# Patient Record
Sex: Male | Born: 1980 | Race: White | Hispanic: No | Marital: Single | Smoking: Never smoker
Health system: Southern US, Community
[De-identification: ages and names within clinical notes are randomized; demographics above are authoritative.]

## PROBLEM LIST (undated history)

## (undated) DIAGNOSIS — G4730 Sleep apnea, unspecified: Secondary | ICD-10-CM

## (undated) DIAGNOSIS — K219 Gastro-esophageal reflux disease without esophagitis: Secondary | ICD-10-CM

## (undated) HISTORY — PX: VASECTOMY: SHX75

## (undated) HISTORY — DX: Sleep apnea, unspecified: G47.30

## (undated) HISTORY — DX: Gastro-esophageal reflux disease without esophagitis: K21.9

---

## 2010-11-30 ENCOUNTER — Other Ambulatory Visit: Payer: Self-pay | Admitting: Gastroenterology

## 2010-12-06 ENCOUNTER — Ambulatory Visit
Admission: RE | Admit: 2010-12-06 | Discharge: 2010-12-06 | Disposition: A | Discharge status: Home / Self Care | Payer: BC Managed Care – PPO | Source: Ambulatory Visit | Attending: Gastroenterology | Admitting: Gastroenterology

## 2012-06-15 IMAGING — RF DG ESOPHAGUS
12 of 14 series · 19 of 24 positions shown · non-contrast
Comparison: None.

CLINICAL DATA: Dysphagia

ESOPHOGRAM/BARIUM SWALLOW
TECHNIQUE: Combined double contrast and single contrast
examination performed using effervescent crystals, thick barium
liquid, and thin barium liquid.
Fluoroscopy time:  1.6 minutes.

[Series 3: run · 1 of 1 slices shown (1 of 12)]
[im 1/1]
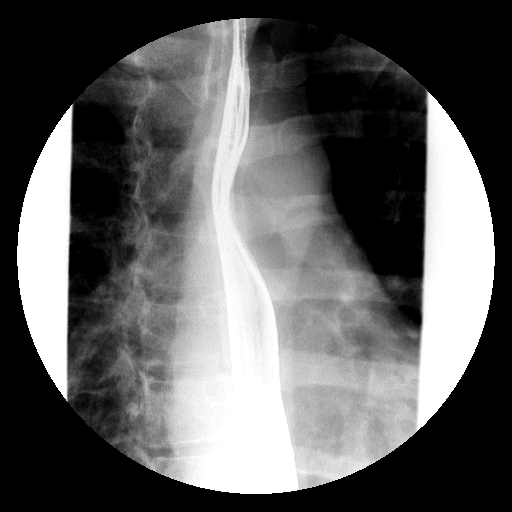

[Series 4: run · 1 of 1 slices shown (2 of 12)]
[im 1/1]
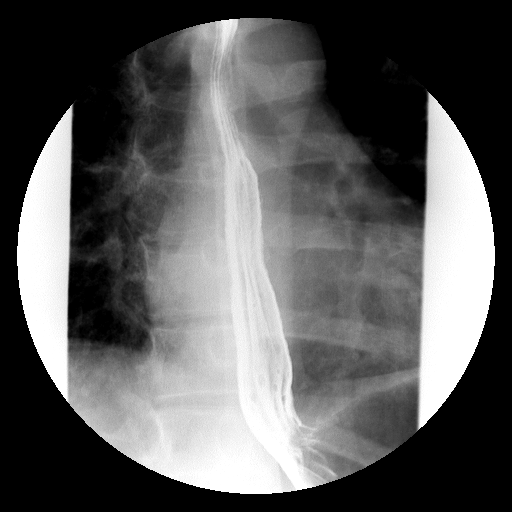

[Series 6: run · 1 of 1 slices shown (3 of 12)]
[im 1/1]
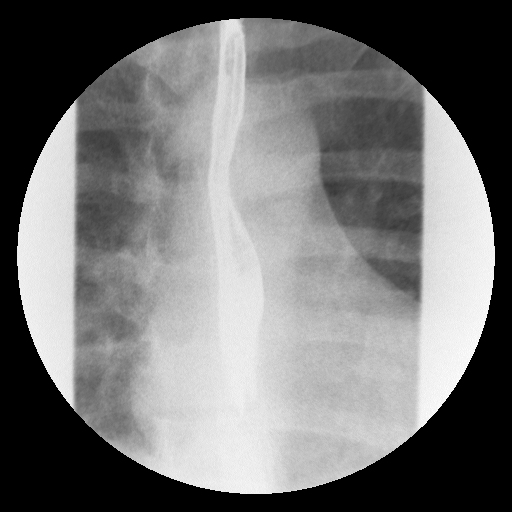

[Series 7: run · 1 of 1 slices shown (4 of 12)]
[im 1/1]
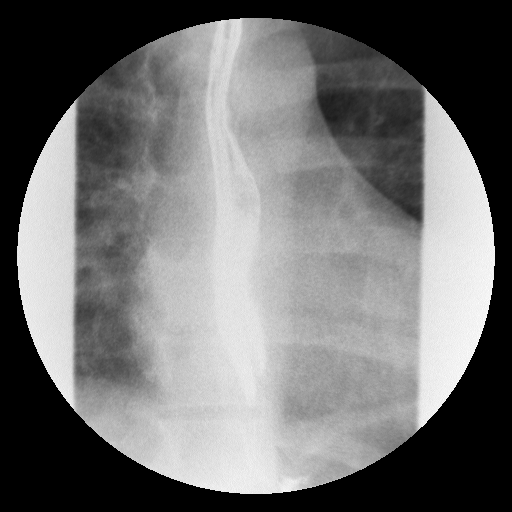

[Series 9: run · 5 of 9 slices shown (5 of 12)]
[im 1/9]
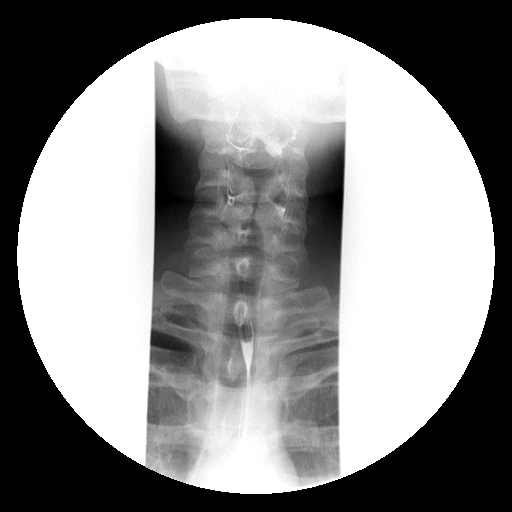
[im 2/9]
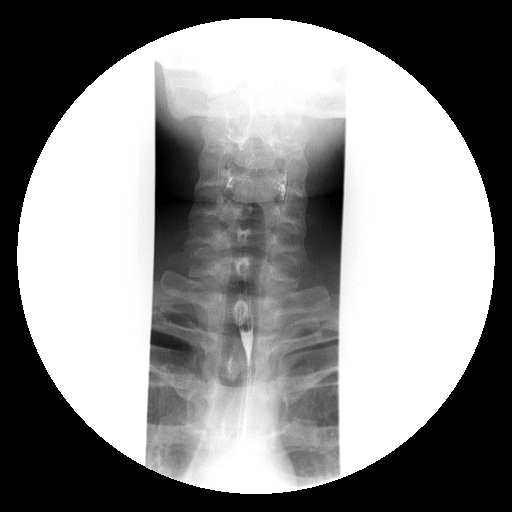
[im 5/9]
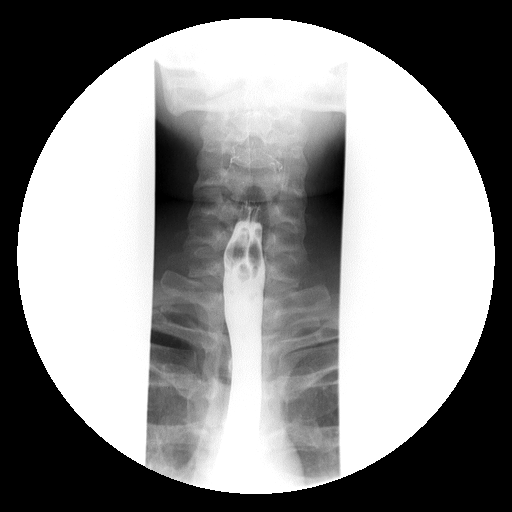
[im 6/9]
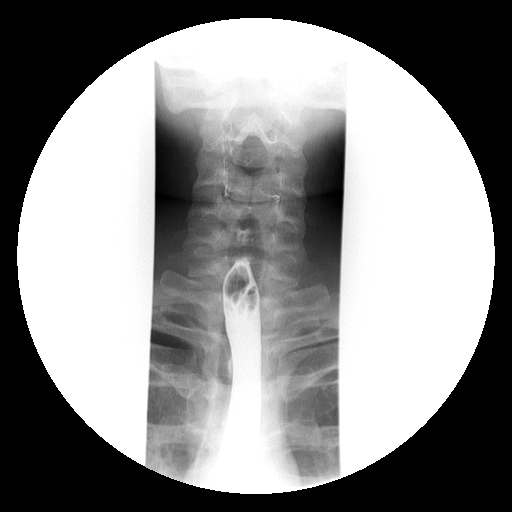
[im 7/9]
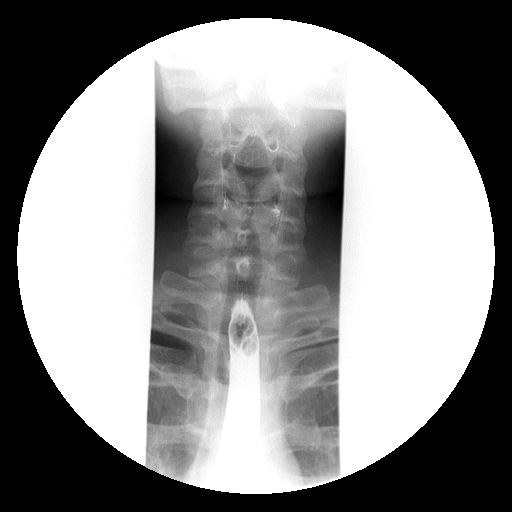

[Series 10: run · 4 of 7 slices shown (6 of 12)]
[im 1/7]
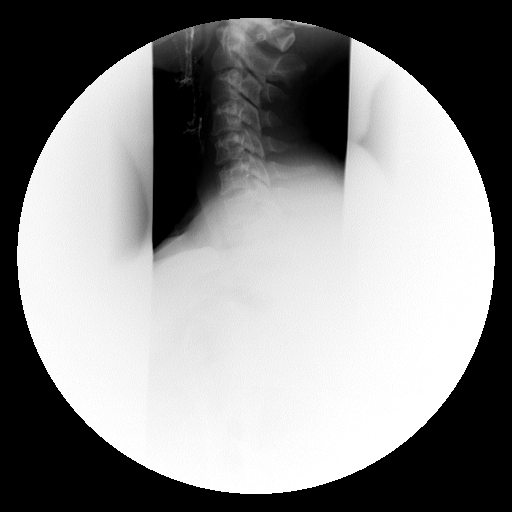
[im 2/7]
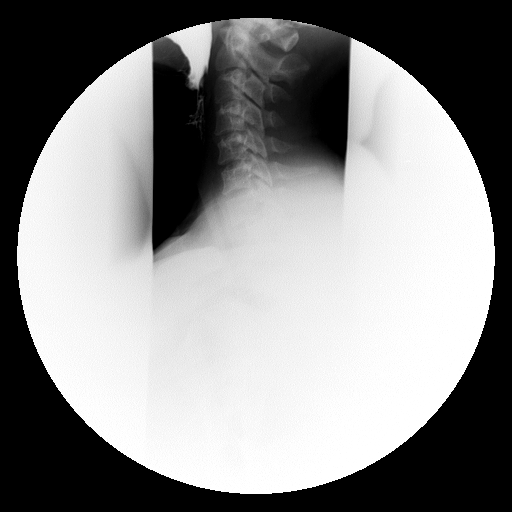
[im 4/7]
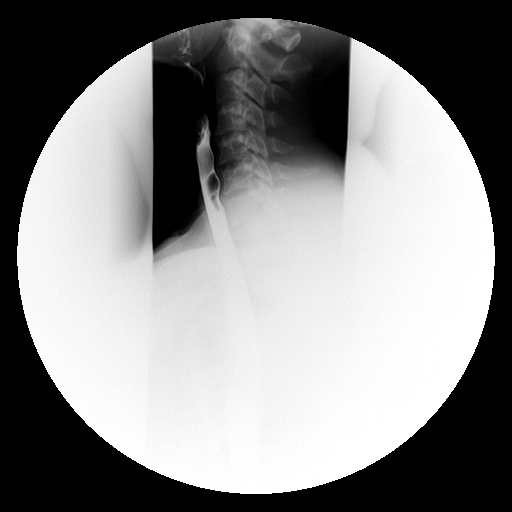
[im 5/7]
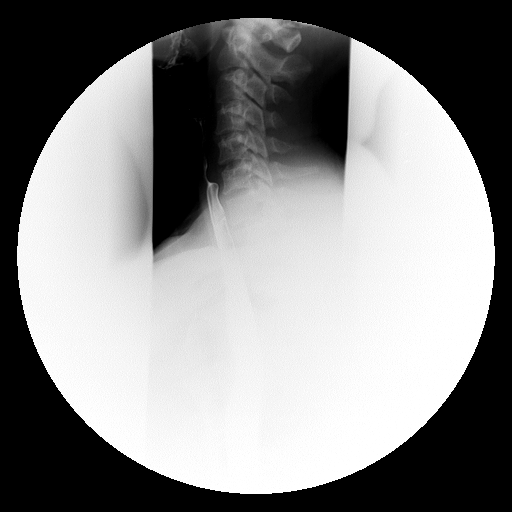

[Series 11: run · 1 of 1 slices shown (7 of 12)]
[im 1/1]
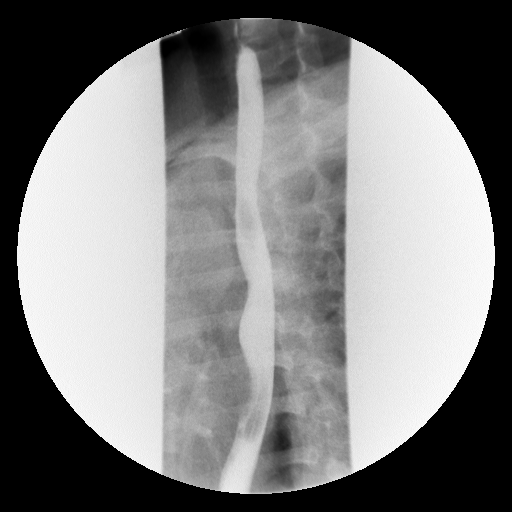

[Series 12: run · 1 of 1 slices shown (8 of 12)]
[im 1/1]
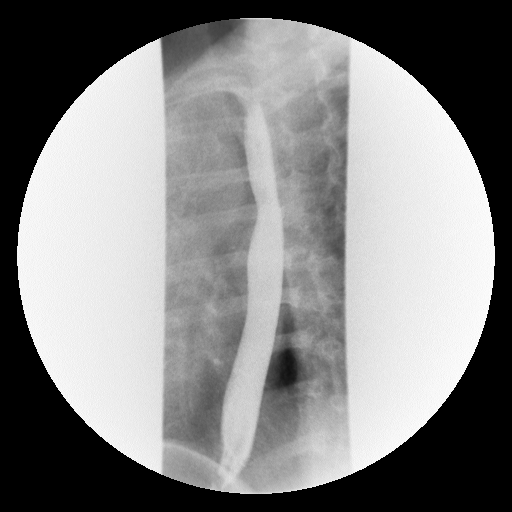

[Series 13: run · 1 of 1 slices shown (9 of 12)]
[im 1/1]
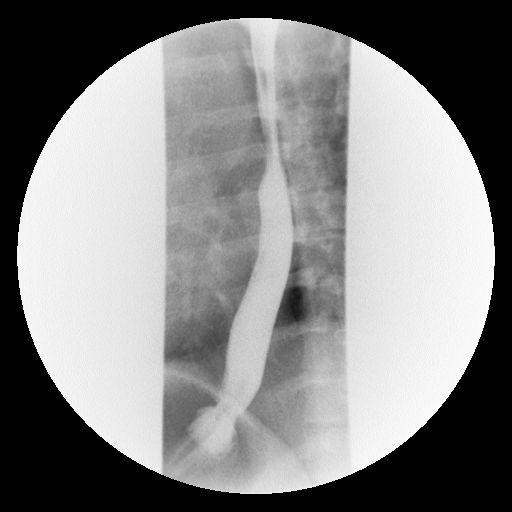

[Series 14: run · 1 of 1 slices shown (10 of 12)]
[im 1/1]
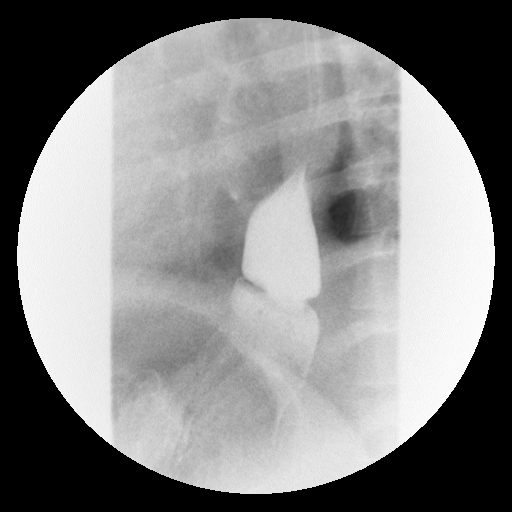

[Series 17: run · 1 of 1 slices shown (11 of 12)]
[im 1/1]
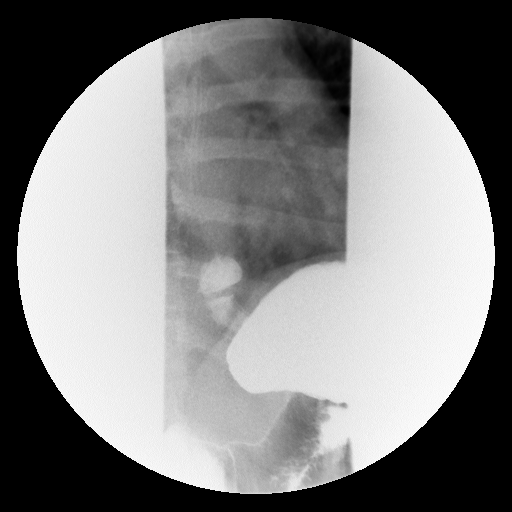

[Series 18: run · 1 of 1 slices shown (12 of 12)]
[im 1/1]
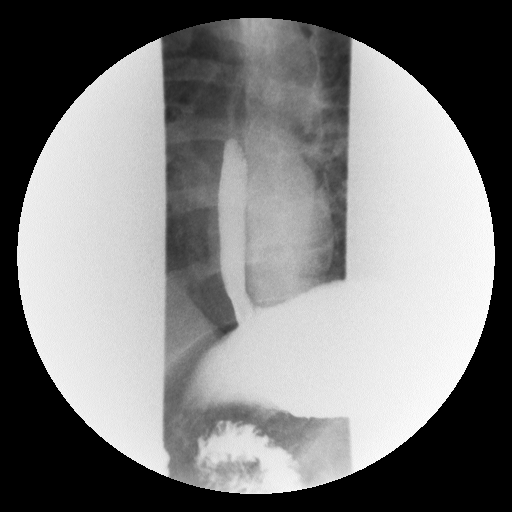

[19 of 24 positions shown; findings below may reference images not displayed]

FINDINGS: The esophagus does not distend optimally on the double
contrast study but no mucosal abnormality is seen.  A single
contrast study shows the swallowing mechanism to be normal.
Esophageal peristalsis is normal.  There is however a small hiatal
hernia present.  Moderate gastroesophageal reflux is demonstrated.
A barium pill was given at the end of the study which did pass into
the stomach without delay.
IMPRESSION: 1.  Small hiatal hernia with moderate reflux.
2.  Barium pill passes in the stomach without delay.

## 2017-05-17 DIAGNOSIS — Z23 Encounter for immunization: Secondary | ICD-10-CM | POA: Diagnosis not present

## 2017-05-17 DIAGNOSIS — Z1389 Encounter for screening for other disorder: Secondary | ICD-10-CM | POA: Diagnosis not present

## 2017-05-17 DIAGNOSIS — K219 Gastro-esophageal reflux disease without esophagitis: Secondary | ICD-10-CM | POA: Diagnosis not present

## 2017-05-17 DIAGNOSIS — Z0000 Encounter for general adult medical examination without abnormal findings: Secondary | ICD-10-CM | POA: Diagnosis not present

## 2018-05-14 DIAGNOSIS — Z0000 Encounter for general adult medical examination without abnormal findings: Secondary | ICD-10-CM | POA: Diagnosis not present

## 2018-05-14 DIAGNOSIS — Z125 Encounter for screening for malignant neoplasm of prostate: Secondary | ICD-10-CM | POA: Diagnosis not present

## 2018-05-14 DIAGNOSIS — R82998 Other abnormal findings in urine: Secondary | ICD-10-CM | POA: Diagnosis not present

## 2018-05-21 DIAGNOSIS — K219 Gastro-esophageal reflux disease without esophagitis: Secondary | ICD-10-CM | POA: Diagnosis not present

## 2018-05-21 DIAGNOSIS — Z0000 Encounter for general adult medical examination without abnormal findings: Secondary | ICD-10-CM | POA: Diagnosis not present

## 2018-05-21 DIAGNOSIS — Z1389 Encounter for screening for other disorder: Secondary | ICD-10-CM | POA: Diagnosis not present

## 2018-05-21 DIAGNOSIS — Z23 Encounter for immunization: Secondary | ICD-10-CM | POA: Diagnosis not present

## 2022-07-17 ENCOUNTER — Telehealth: Payer: Self-pay | Admitting: Neurology

## 2022-07-17 NOTE — Telephone Encounter (Signed)
LVM informing pt of need to reschedule 11/7 appointment - MD out

## 2022-07-24 ENCOUNTER — Institutional Professional Consult (permissible substitution): Payer: Commercial Managed Care - PPO | Admitting: Neurology

## 2022-08-07 ENCOUNTER — Ambulatory Visit (INDEPENDENT_AMBULATORY_CARE_PROVIDER_SITE_OTHER): Payer: Commercial Managed Care - PPO | Admitting: Neurology

## 2022-08-07 ENCOUNTER — Encounter: Payer: Self-pay | Admitting: Neurology

## 2022-08-07 VITALS — BP 128/85 | HR 66 | Ht 66.0 in | Wt 196.2 lb

## 2022-08-07 DIAGNOSIS — R351 Nocturia: Secondary | ICD-10-CM

## 2022-08-07 DIAGNOSIS — G4719 Other hypersomnia: Secondary | ICD-10-CM

## 2022-08-07 DIAGNOSIS — R0683 Snoring: Secondary | ICD-10-CM | POA: Diagnosis not present

## 2022-08-07 DIAGNOSIS — Z9189 Other specified personal risk factors, not elsewhere classified: Secondary | ICD-10-CM

## 2022-08-07 DIAGNOSIS — E669 Obesity, unspecified: Secondary | ICD-10-CM

## 2022-08-07 DIAGNOSIS — Z820 Family history of epilepsy and other diseases of the nervous system: Secondary | ICD-10-CM | POA: Diagnosis not present

## 2022-08-07 NOTE — Patient Instructions (Signed)

## 2022-08-07 NOTE — Progress Notes (Signed)
Subjective:    Patient ID: Brian Harris is a 41 y.o. male.  HPI    Brian Foley, MD, PhD [facility] [address] [address] [place], [place] [address]  Dear Dr. Evlyn Harris,  I saw your patient, Brian Harris, upon your kind request in my sleep clinic today for initial consultation of his sleep disorder, in particular, concern for underlying obstructive sleep apnea.  The patient is unaccompanied today.  As you know, [name] is a 41 year old male with an underlying medical history of reflux disease, allergies, and borderline obesity, who reports snoring and excessive daytime somnolence as well as nonrestorative sleep.  I reviewed your office note from 06/11/2022.  His Epworth sleepiness score is 7 out of 24, fatigue severity score is 37 out of 63.  He believes that his mom has sleep apnea and has an oral appliance.  His sister is being evaluated for sleep issues as well.  He lives with his wife and 2 children, ages 70 and 76.  They have no pets in the household.  He works for a [employer], works from home.  Bedtime is generally around 9 PM and rise time around 5 AM but he is often awake before, he has trouble maintaining sleep.  He does not currently take any prescription medications.  He has nocturia about once per average night and denies any recurrent nocturnal or morning headaches.  He has limited caffeine, 2 cups of coffee in the morning, occasional soda after lunch but typically no later than 2:30 PM.  He has had slow weight gain over time, over the years.  They have a TV in the bedroom and his wife puts it on a sleep timer typically.  His snoring has become worse over time and often his wife ends up sleeping in a different bedroom.  He drinks alcohol maybe once a week, up to 3 drinks.  He is a non-smoker.  His Past Medical History Is Significant For: History reviewed. No pertinent past medical history.  His Past Surgical History Is Significant  For: History reviewed. No pertinent surgical history.  His Family History Is Significant For: Family History  Problem Relation Age of Onset   Breast cancer Mother     His Social History Is Significant For: Social History   Socioeconomic History   Marital status: Single    Spouse name: Not on file   Number of children: Not on file   Years of education: Not on file   Highest education level: Not on file  Occupational History   Not on file  Tobacco Use   Smoking status: Never   Smokeless tobacco: Never  Vaping Use   Vaping Use: Never used  Substance and Sexual Activity   Alcohol use: Yes    Comment: 3 per week   Drug use: Never   Sexual activity: Not on file  Other Topics Concern   Not on file  Social History Narrative   Caffiene 2 cups daily   Married, Wife, 2 kids   Bachelors degree,    Works as an Community education officer of [employer] Strain: Not on [employer] Insecurity: Not on file  Transportation Needs: Not on file  Physical Activity: Not on file  Stress: Not on file  Social Connections: Not on file    His Allergies Are:  No Known Allergies:   His Current Medications Are:  No outpatient encounter medications on file as of 08/07/2022.  No facility-administered encounter medications on file as of 08/07/2022.  :   Review of Systems:  Out of a complete 14 point review of systems, all are reviewed and negative with the exception of these symptoms as listed below:  Review of Systems  Neurological:        Snoring, daytime fatigue.  ESS 7, FSS 37.  No previous sleep study.    Objective:  Neurological Exam  Physical Exam Physical Examination:   Vitals:   08/07/22 1117  BP: 128/85  Pulse: 66    General Examination: The patient is a very pleasant 41 y.o. male in no acute distress. He appears well-developed and well-nourished and well groomed.   HEENT: Normocephalic, atraumatic, pupils are equal, round and reactive to  light, extraocular tracking is good without limitation to gaze excursion or nystagmus noted. Hearing is grossly intact. Face is symmetric with normal facial animation. Speech is clear with no dysarthria noted. There is no hypophonia. There is no lip, neck/head, jaw or voice tremor. Neck is supple with full range of passive and active motion. There are no carotid bruits on auscultation. Oropharynx exam reveals: mild mouth dryness, good dental hygiene and mild airway crowding, due to tonsillar size of about 1+ bilaterally, enlarged uvula.  Mallampati class III.  Neck circumference of 16-1/2 inches.  Minimal overbite.  Tongue protrudes centrally and palate elevates symmetrically, nasal inspection reveals narrow nasal passages, otherwise benign.  Chest: Clear to auscultation without wheezing, rhonchi or crackles noted.  Heart: S1+S2+0, regular and normal without murmurs, rubs or gallops noted.   Abdomen: Soft, non-tender and non-distended.  Extremities: There is no pitting edema in the distal lower extremities bilaterally.   Skin: Warm and dry without trophic changes noted.   Musculoskeletal: exam reveals no obvious joint deformities.   Neurologically:  Mental status: The patient is awake, alert and oriented in all 4 spheres. His immediate and remote memory, attention, language skills and fund of knowledge are appropriate. There is no evidence of aphasia, agnosia, apraxia or anomia. Speech is clear with normal prosody and enunciation. Thought process is linear. Mood is normal and affect is normal.  Cranial nerves II - XII are as described above under HEENT exam.  Motor exam: Normal bulk, strength and tone is noted. There is no obvious action or resting tremor.  Fine motor skills and coordination: grossly intact.  Cerebellar testing: No dysmetria or intention tremor. There is no truncal or gait ataxia.  Sensory exam: intact to light touch in the upper and lower extremities.  Gait, station and  balance: He stands easily. No veering to one side is noted. No leaning to one side is noted. Posture is age-appropriate and stance is narrow based. Gait shows normal stride length and normal pace. No problems turning are noted.   Assessment and Plan:  In summary, Brian Harris is a very pleasant 41 y.o.-year old male with an underlying medical history of reflux disease, allergies, and borderline obesity, whose history and physical exam are concerning for sleep disordered breathing, supporting a current working diagnosis of unspecified sleep apnea, with the main differential diagnoses of obstructive sleep apnea (OSA) versus upper airway resistance syndrome (UARS) versus central sleep apnea (CSA), or mixed sleep apnea. A laboratory attended sleep study is considered gold standard for evaluation of sleep disordered breathing and is recommended at this time and clinically justified.   I had a long chat with the patient about my findings and the diagnosis of sleep apnea, particularly OSA, its prognosis and  treatment options. We talked about medical/conservative treatments, surgical interventions and non-pharmacological approaches for symptom control. I explained, in particular, the risks and ramifications of untreated moderate to severe OSA, especially with respect to developing cardiovascular disease down the road, including congestive heart failure (CHF), difficult to treat hypertension, cardiac arrhythmias (particularly A-fib), neurovascular complications including TIA, stroke and dementia. Even type 2 diabetes has, in part, been linked to untreated OSA. Symptoms of untreated OSA may include (but may not be limited to) daytime sleepiness, nocturia (i.e. frequent nighttime urination), memory problems, mood irritability and suboptimally controlled or worsening mood disorder such as depression and/or anxiety, lack of energy, lack of motivation, physical discomfort, as well as recurrent headaches, especially  morning or nocturnal headaches. We talked about the importance of maintaining a healthy lifestyle and striving for healthy weight. In addition, we talked about the importance of striving for and maintaining good sleep hygiene. I recommended the following at this time: sleep study.  I outlined the differences between a laboratory attended sleep study which is considered more comprehensive and accurate over the option of a home sleep test (HST); the latter may lead to underestimation of sleep disordered breathing in some instances and does not help with diagnosing upper airway resistance syndrome and is not accurate enough to diagnose primary central sleep apnea typically. I explained the different sleep test procedures to the patient in detail and also outlined possible surgical and non-surgical treatment options of OSA, including the use of a pressure airway pressure (PAP) device (ie CPAP, AutoPAP/APAP or BiPAP in certain circumstances), a custom-made dental device (aka oral appliance, which would require a referral to a specialist dentist or orthodontist typically, and is generally speaking not considered a good choice for patients with full dentures or edentulous state), upper airway surgical options, such as traditional UPPP (which is not considered a first-line treatment) or the Inspire device (hypoglossal nerve stimulator, which would involve a referral for consultation with an ENT surgeon, after careful selection, following inclusion criteria). I explained the PAP treatment option to the patient in detail, as this is generally considered first-line treatment.  The patient indicated that he would be willing to try PAP therapy, if the need arises. I explained the importance of being compliant with PAP treatment, not only for insurance purposes but primarily to improve patient's symptoms symptoms, and for the patient's long term health benefit, including to reduce His cardiovascular risks longer-term.    We  will pick up our discussion about the next steps and treatment options after testing.  We will keep him posted as to the test results by phone call and/or MyChart messaging where possible.  We will plan to follow-up in sleep clinic accordingly as well.  I answered all his questions today and the patient was in agreement.   I encouraged him to call with any interim questions, concerns, problems or updates or email [place] through MyChart.  Generally speaking, sleep test authorizations may take up to 2 weeks, sometimes less, sometimes longer, the patient is encouraged to get in touch with [place] if they do not hear back from the sleep lab staff directly within the next 2 weeks.  Thank you very much for allowing me to participate in the care of this nice patient. If I can be of any further assistance to you please do not hesitate to call me at [phone].  Sincerely,   Brian Foley, MD, PhD

## 2022-10-11 ENCOUNTER — Telehealth: Payer: Self-pay | Admitting: Neurology

## 2022-10-11 NOTE — Telephone Encounter (Signed)
HST- [facility] no auth req ref # [id number] on 10/10/22   Patient is scheduled at [facility] for 10/30/22 at 1:30 pm.  Mailed packet to the patient.

## 2022-10-30 ENCOUNTER — Ambulatory Visit: Payer: Commercial Managed Care - PPO | Admitting: Neurology

## 2022-10-30 DIAGNOSIS — Z9189 Other specified personal risk factors, not elsewhere classified: Secondary | ICD-10-CM

## 2022-10-30 DIAGNOSIS — R0683 Snoring: Secondary | ICD-10-CM

## 2022-10-30 DIAGNOSIS — R351 Nocturia: Secondary | ICD-10-CM

## 2022-10-30 DIAGNOSIS — G4719 Other hypersomnia: Secondary | ICD-10-CM

## 2022-10-30 DIAGNOSIS — E66811 Obesity, class 1: Secondary | ICD-10-CM

## 2022-10-30 DIAGNOSIS — Z820 Family history of epilepsy and other diseases of the nervous system: Secondary | ICD-10-CM

## 2022-10-30 DIAGNOSIS — E669 Obesity, unspecified: Secondary | ICD-10-CM

## 2022-10-30 DIAGNOSIS — G4733 Obstructive sleep apnea (adult) (pediatric): Secondary | ICD-10-CM

## 2022-11-01 NOTE — Progress Notes (Signed)
See procedure note.

## 2022-11-02 NOTE — Addendum Note (Signed)
Addended by: Star Age on: 11/02/2022 10:50 AM   Modules accepted: Orders

## 2022-11-02 NOTE — Procedures (Signed)
   [facility]  HOME SLEEP TEST (Watch PAT) REPORT  STUDY DATE: 10/30/2022  DOB: 01-11-1981  MRN: [id number]  ORDERING CLINICIAN: Star Age, MD, PhD   REFERRING CLINICIAN: Reynold Bowen, MD   CLINICAL INFORMATION/HISTORY: 42 year old male with an underlying medical history of reflux disease, allergies, and borderline obesity, who reports snoring and excessive daytime somnolence as well as nonrestorative sleep.   Epworth sleepiness score: 7/24.  BMI: 31.5 kg/m  FINDINGS:   Sleep Summary:   Total Recording Time (hours, min): 8 hours, 37 min  Total Sleep Time (hours, min):  7 hours, 50 min  Percent REM (%):    31.9%   Respiratory Indices:   Calculated pAHI (per hour):  13.8/hour         REM pAHI:    29.6/hour       NREM pAHI: 6.7/hour  Central pAHI: 0/hour  Oxygen Saturation Statistics:    Oxygen Saturation (%) Mean: 93%   Minimum oxygen saturation (%):                 89%   O2 Saturation Range (%): 89 - 98%    O2 Saturation (minutes) <=88%: 0 min  Pulse Rate Statistics:   Pulse Mean (bpm):   59/min    Pulse Range (47- 110/min)   IMPRESSION: OSA (obstructive sleep apnea), mild  RECOMMENDATION:  This home sleep test demonstrates overall mild obstructive sleep apnea with a total AHI of 13.8/hour and O2 nadir of 89%. Snoring was detected, and was intermittent, in the mild to moderate range. Given the patient's medical history and sleep related complaints, therapy with a  positive airway pressure device can be considered. Treatment can be achieved in the form of autoPAP trial/titration at home for now. A full night, in-lab PAP titration study may aid in improving proper treatment settings and with mask fit, if needed, down the road. Alternative treatments may include weight loss (where appropriate) along with avoidance of the supine sleep position (if possible), or an oral appliance in appropriate candidates.   Please note that untreated  obstructive sleep apnea may carry additional perioperative morbidity. Patients with significant obstructive sleep apnea should receive perioperative PAP therapy and the surgeons and particularly the anesthesiologist should be informed of the diagnosis and the severity of the sleep disordered breathing. The patient should be cautioned not to drive, work at heights, or operate dangerous or heavy equipment when tired or sleepy. Review and reiteration of good sleep hygiene measures should be pursued with any patient. Other causes of the patient's symptoms, including circadian rhythm disturbances, an underlying mood disorder, medication effect and/or an underlying medical problem cannot be ruled out based on this test. Clinical correlation is recommended.  The patient and his referring provider will be notified of the test results. The patient will be seen in follow up in sleep clinic at [facility], as necessary.  I certify that I have reviewed the raw data recording prior to the issuance of this report in accordance with the standards of the American Academy of Sleep Medicine (AASM).    INTERPRETING PHYSICIAN:   Star Age, MD, PhD Medical Director, [facility] [place], ABPN (Neurology and Sleep)   [facility] [address], [place], [place] [address] [phone]

## 2022-11-05 ENCOUNTER — Telehealth: Payer: Self-pay | Admitting: *Deleted

## 2022-11-05 NOTE — Telephone Encounter (Signed)
Called pt & discussed sleep study results. Patient aware HST showed mild OSA. He is ok with proceeding with autopap but only if he is satisfied with cost which he will discuss in detail with the DME company before setup. We discussed insurance compliance requirements which includes using the machine at least 4 hours at night and being seen in our office for an initial follow-up between 30 and 90 days after setup. Pt's questions were answered. Will try [place] first for DME.   Referral faxed to [facility]. Received a receipt of confirmation. Results sent to Dr Forde Dandy.

## 2022-11-05 NOTE — Telephone Encounter (Signed)
-----   Message from Star Age, MD sent at 11/02/2022 10:50 AM EST ----- Patient referred by Dr. Forde Dandy, seen by me on 08/07/2022, HST 10/30/2022.    Please call and notify the patient that the recent home sleep test showed obstructive sleep apnea. OSA is overall mild, but worth treating to see if he feels better after treatment. To that end I recommend treatment for this in the form of autoPAP, which means, that we don't have to bring him in for a sleep study with CPAP, but will let him try an autoPAP machine at home, through a DME company (of his choice, or as per insurance requirement). The DME representative will educate him on how to use the machine, how to put the mask on, etc. I have placed an order in the chart. Please send referral, talk to patient, send report to referring MD. We will need a FU in sleep clinic for 10 weeks post-PAP set up, please arrange that with me or one of our NPs. Thanks,   Star Age, MD, PhD [facility])

## 2022-11-08 NOTE — Telephone Encounter (Signed)
Received fax from [facility] which stated they do not take pt's insurance for CPAP. I have called patient and discussed other options. Will refer to [facility]. He was given their phone number to call if needed.   Referral sent to [facility].

## 2022-11-12 NOTE — Telephone Encounter (Signed)
[employer] confirmed receipt of order.

## 2022-12-07 ENCOUNTER — Telehealth: Payer: Self-pay | Admitting: Neurology

## 2022-12-07 NOTE — Telephone Encounter (Signed)
Pt was scheduled for his initial CPAP on (01/30/23) Pt was informed to bring machine and power cord to the appointment.   DME and between dates are in pt's SnapShot.

## 2023-01-29 NOTE — Patient Instructions (Signed)

## 2023-01-29 NOTE — Progress Notes (Unsigned)
PATIENT: Brian Harris DOB: 04-10-1981  REASON FOR VISIT: follow up HISTORY FROM: patient  No chief complaint on file.    HISTORY OF PRESENT ILLNESS:  01/29/23 ALL:  Brian Harris is a 42 y.o. male here today for follow up for OSA on CPAP. He was seen in consult with Dr Frances Furbish 07/2022 for snoring, daytime sleepiness and non restorative sleep. HST 10/2022 showed mild OSA with total AHI 13.8/hrand O2 nadir of 89%. AutoPAP advised.     HISTORY: (copied from Dr Teofilo Pod previous note)  Dear Dr. Evlyn Kanner,   I saw your patient, Brian Harris, upon your kind request in my sleep clinic today for initial consultation of his sleep disorder, in particular, concern for underlying obstructive sleep apnea.  The patient is unaccompanied today.  As you know, [name] is a 42 year old male with an underlying medical history of reflux disease, allergies, and borderline obesity, who reports snoring and excessive daytime somnolence as well as nonrestorative sleep.  I reviewed your office note from 06/11/2022.  His Epworth sleepiness score is 7 out of 24, fatigue severity score is 37 out of 63.  He believes that his mom has sleep apnea and has an oral appliance.  His sister is being evaluated for sleep issues as well.  He lives with his wife and 2 children, ages 80 and 30.  They have no pets in the household.  He works for a [employer], works from home.  Bedtime is generally around 9 PM and rise time around 5 AM but he is often awake before, he has trouble maintaining sleep.  He does not currently take any prescription medications.  He has nocturia about once per average night and denies any recurrent nocturnal or morning headaches.  He has limited caffeine, 2 cups of coffee in the morning, occasional soda after lunch but typically no later than 2:30 PM.  He has had slow weight gain over time, over the years.  They have a TV in the bedroom and his wife puts it on a sleep timer typically.  His  snoring has become worse over time and often his wife ends up sleeping in a different bedroom.  He drinks alcohol maybe once a week, up to 3 drinks.  He is a non-smoker.   REVIEW OF SYSTEMS: Out of a complete 14 system review of symptoms, the patient complains only of the following symptoms, none and all other reviewed systems are negative.  ESS: previously 7/24  ALLERGIES: No Known Allergies  HOME MEDICATIONS: No outpatient medications prior to visit.   No facility-administered medications prior to visit.    PAST MEDICAL HISTORY: No past medical history on file.  PAST SURGICAL HISTORY: No past surgical history on file.  FAMILY HISTORY: Family History  Problem Relation Age of Onset   Breast cancer Mother     SOCIAL HISTORY: Social History   Socioeconomic History   Marital status: Single    Spouse name: Not on file   Number of children: Not on file   Years of education: Not on file   Highest education level: Not on file  Occupational History   Not on file  Tobacco Use   Smoking status: Never   Smokeless tobacco: Never  Vaping Use   Vaping Use: Never used  Substance and Sexual Activity   Alcohol use: Yes    Comment: 3 per week   Drug use: Never   Sexual activity: Not on file  Other Topics Concern   Not on  file  Social History Narrative   Caffiene 2 cups daily   Married, Wife, 2 kids   Bachelors degree,    Works as an Producer, television/film/video Strain: Not on [employer] Insecurity: Not on file  Transportation Needs: Not on file  Physical Activity: Not on file  Stress: Not on file  Social Connections: Not on file  Intimate Partner Violence: Not on file     PHYSICAL EXAM  There were no vitals filed for this visit. There is no height or weight on file to calculate BMI.  Generalized: Well developed, in no acute distress  Cardiology: normal rate and rhythm, no murmur noted Respiratory: clear to auscultation  bilaterally  Neurological examination  Mentation: Alert oriented to time, place, history taking. Follows all commands speech and language fluent Cranial nerve II-XII: Pupils were equal round reactive to light. Extraocular movements were full, visual field were full  Motor: The motor testing reveals 5 over 5 strength of all 4 extremities. Good symmetric motor tone is noted throughout.  Gait and station: Gait is normal.    DIAGNOSTIC DATA (LABS, IMAGING, TESTING) - I reviewed patient records, labs, notes, testing and imaging myself where available.      No data to display           No results found for: "WBC", "HGB", "HCT", "MCV", "PLT" No results found for: "NA", "K", "CL", "CO2", "GLUCOSE", "BUN", "CREATININE", "CALCIUM", "PROT", "ALBUMIN", "AST", "ALT", "ALKPHOS", "BILITOT", "GFRNONAA", "GFRAA" No results found for: "CHOL", "HDL", "LDLCALC", "LDLDIRECT", "TRIG", "CHOLHDL" No results found for: "HGBA1C" No results found for: "VITAMINB12" No results found for: "TSH"   ASSESSMENT AND PLAN 42 y.o. year old male  has no past medical history on file. here with   No diagnosis found.    [name] is doing well on CPAP therapy. Compliance report reveals ***. *** was encouraged to continue using CPAP nightly and for greater than 4 hours each night. We will update supply orders as indicated. Risks of untreated sleep apnea review and education materials provided. Healthy lifestyle habits encouraged. *** will follow up in ***, sooner if needed. *** verbalizes understanding and agreement with this plan.    No orders of the defined types were placed in this encounter.    No orders of the defined types were placed in this encounter.     Shawnie Dapper, FNP-C 01/29/2023, 5:17 PM [facility] [address] [place], [place] [address] [phone]

## 2023-01-30 ENCOUNTER — Ambulatory Visit (INDEPENDENT_AMBULATORY_CARE_PROVIDER_SITE_OTHER): Payer: Commercial Managed Care - PPO | Admitting: Family Medicine

## 2023-01-30 ENCOUNTER — Encounter: Payer: Self-pay | Admitting: Family Medicine

## 2023-01-30 VITALS — BP 138/85 | HR 76 | Ht 66.0 in | Wt 200.4 lb

## 2023-01-30 DIAGNOSIS — G4733 Obstructive sleep apnea (adult) (pediatric): Secondary | ICD-10-CM | POA: Diagnosis not present

## 2024-01-30 NOTE — Progress Notes (Signed)
 Brian Harris

## 2024-02-03 ENCOUNTER — Encounter: Payer: Self-pay | Admitting: Family Medicine

## 2024-02-03 ENCOUNTER — Ambulatory Visit: Payer: Commercial Managed Care - PPO | Admitting: Family Medicine

## 2024-02-03 VITALS — BP 132/84 | HR 58 | Ht 66.0 in | Wt 200.2 lb

## 2024-02-03 DIAGNOSIS — G4733 Obstructive sleep apnea (adult) (pediatric): Secondary | ICD-10-CM | POA: Diagnosis not present

## 2024-02-03 NOTE — Patient Instructions (Signed)

## 2024-02-03 NOTE — Progress Notes (Signed)
 Community message sent to [employer] that order placed.

## 2024-02-03 NOTE — Progress Notes (Signed)
 PATIENT: Brian Harris DOB: 03/19/1981  REASON FOR VISIT: follow up HISTORY FROM: patient  Chief Complaint  Patient presents with   Follow-up    Rm 2, alone. Last seen 01/30/23. CPAP f/u. Doing well, no changes.      HISTORY OF PRESENT ILLNESS:  02/03/24 ALL:  Brian Harris returns for follow up for OSA on CPAP. He was last seen by me 01/2023 and doing well. He continues to use therapy most every day. He went to [facility], recently, and forgot his machine. Otherwise, using it daily. He notes significant improvement in sleep quality and daytime energy levels. No concerns with machine or supplies.     01/30/2023 ALL:  Brian Harris is a 43 y.o. male here today for follow up for OSA on CPAP. He was seen in consult with Dr Omar Bibber 07/2022 for snoring, daytime sleepiness and non restorative sleep. HST 10/2022 showed mild OSA with total AHI 13.8/hr and O2 nadir of 89%. AutoPAP advised. He reports doing well on therapy. He is using CPAP nightly for about 6-7 hours. He does have intermittent congestion related to seasonal allergies. He has noted improvement in sleep quality and no longer snoring. He denies concerns with machine or supplies.     HISTORY: (copied from Dr Dail Drought previous note)  Dear Dr. Jesse Moritz,   I saw your patient, Brian Harris, upon your kind request in my sleep clinic today for initial consultation of his sleep disorder, in particular, concern for underlying obstructive sleep apnea.  The patient is unaccompanied today.  As you know, Brian Harris is a 43 year old male with an underlying medical history of reflux disease, allergies, and borderline obesity, who reports snoring and excessive daytime somnolence as well as nonrestorative sleep.  I reviewed your office note from 06/11/2022.  His Epworth sleepiness score is 7 out of 24, fatigue severity score is 37 out of 63.  He believes that his mom has sleep apnea and has an oral appliance.  His sister is being evaluated for  sleep issues as well.  He lives with his wife and 2 children, ages 61 and 44.  They have no pets in the household.  He works for a [employer], works from home.  Bedtime is generally around 9 PM and rise time around 5 AM but he is often awake before, he has trouble maintaining sleep.  He does not currently take any prescription medications.  He has nocturia about once per average night and denies any recurrent nocturnal or morning headaches.  He has limited caffeine, 2 cups of coffee in the morning, occasional soda after lunch but typically no later than 2:30 PM.  He has had slow weight gain over time, over the years.  They have a TV in the bedroom and his wife puts it on a sleep timer typically.  His snoring has become worse over time and often his wife ends up sleeping in a different bedroom.  He drinks alcohol maybe once a week, up to 3 drinks.  He is a non-smoker.   REVIEW OF SYSTEMS: Out of a complete 14 system review of symptoms, the patient complains only of the following symptoms, none and all other reviewed systems are negative.  ESS: 11/24, previously 10/24  ALLERGIES: No Known Allergies  HOME MEDICATIONS: Outpatient Medications Prior to Visit  Medication Sig Dispense Refill   levocetirizine (XYZAL) 5 MG tablet Take 5 mg by mouth every evening.     No facility-administered medications prior to visit.    PAST MEDICAL  HISTORY: History reviewed. No pertinent past medical history.  PAST SURGICAL HISTORY: History reviewed. No pertinent surgical history.  FAMILY HISTORY: Family History  Problem Relation Age of Onset   Breast cancer Mother     SOCIAL HISTORY: Social History   Socioeconomic History   Marital status: Single    Spouse name: Not on file   Number of children: Not on file   Years of education: Not on file   Highest education level: Not on file  Occupational History   Not on file  Tobacco Use   Smoking status: Never   Smokeless tobacco: Never  Vaping Use    Vaping status: Never Used  Substance and Sexual Activity   Alcohol use: Yes    Comment: 3 per week   Drug use: Never   Sexual activity: Not on file  Other Topics Concern   Not on file  Social History Narrative   Caffiene 2 cups daily   Married, Wife, 2 kids   Bachelors degree,    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  Generalized: Well developed, in no acute distress  Cardiology: normal rate and rhythm, no murmur noted Respiratory: clear to auscultation bilaterally  Neurological examination  Mentation: Alert oriented to time, place, history taking. Follows all commands speech and language fluent Cranial nerve II-XII: Pupils were equal round reactive to light. Extraocular movements were full, visual field were full  Motor: The motor testing reveals 5 over 5 strength of all 4 extremities. Good symmetric motor tone is noted throughout.  Gait and station: Gait is normal.    DIAGNOSTIC DATA (LABS, IMAGING, TESTING) - I reviewed patient records, labs, notes, testing and imaging myself where available.      No data to display           No results found for: "WBC", "HGB", "HCT", "MCV", "PLT" No results found for: "NA", "K", "CL", "CO2", "GLUCOSE", "BUN", "CREATININE", "CALCIUM", "PROT", "ALBUMIN", "AST", "ALT", "ALKPHOS", "BILITOT", "GFRNONAA", "GFRAA" No results found for: "CHOL", "HDL", "LDLCALC", "LDLDIRECT", "TRIG", "CHOLHDL" No results found for: "HGBA1C" No results found for: "VITAMINB12" No results found for:  "TSH"   ASSESSMENT AND PLAN 43 y.o. year old male  has no past medical history on file. here with     ICD-10-CM   1. OSA on CPAP  G47.33 For home use only DME continuous positive airway pressure (CPAP)        Jayvien Rowlette is doing well on CPAP therapy. Compliance report reveals excellent compliance. He was encouraged to continue using CPAP nightly and for greater than 4 hours each night. ESS is slightly elevated but he reports feeling better. We will continue to monitor. We will update supply orders as indicated. Risks of untreated sleep apnea review and education materials provided. Healthy lifestyle habits encouraged. He will follow up in 1 year, sooner if needed. He verbalizes understanding and agreement with this plan.     Orders Placed This  Encounter  Procedures   For home use only DME continuous positive airway pressure (CPAP)    Heated Humidity with all supplies as needed    Length of Need:   Lifetime    Patient has OSA or probable OSA:   Yes    Is the patient currently using CPAP in the home:   Yes    Settings:   Other see comments    CPAP supplies needed:   Mask, headgear, cushions, filters, heated tubing and water chamber     No orders of the defined types were placed in this encounter.     Terrilyn Fick, FNP-C 02/03/2024, 3:08 PM [facility] [address] [place], [place] [address] [phone]

## 2024-07-01 ENCOUNTER — Encounter: Payer: Self-pay | Admitting: Gastroenterology

## 2024-07-20 ENCOUNTER — Ambulatory Visit (AMBULATORY_SURGERY_CENTER): Admitting: *Deleted

## 2024-07-20 VITALS — Ht 66.0 in | Wt 198.0 lb

## 2024-07-20 DIAGNOSIS — Z1211 Encounter for screening for malignant neoplasm of colon: Secondary | ICD-10-CM

## 2024-07-20 MED ORDER — NA SULFATE-K SULFATE-MG SULF 17.5-3.13-1.6 GM/177ML PO SOLN: 1.0000 | Freq: Once | ORAL | 0 refills | Status: AC

## 2024-07-20 NOTE — Progress Notes (Signed)
 Pt's name and DOB verified at the beginning of the pre-visit with 2 identifiers   Pt denies any difficulty with ambulating,sitting, laying down or rolling side to side  Pt has no issues moving head neck or swallowing  No egg or soy allergy known to patient   No issues known to pt with past sedation  No FH of Malignant Hyperthermia  Pt is not on home 02   Pt is not on blood thinners   Pt denies issues with constipation   Pt is not on dialysis  Pt denise any abnormal heart rhythms   Pt denies any upcoming cardiac testing  Patient's chart reviewed by Norleen Schillings CNRA prior to pre-visit and patient appropriate for the [facility].  Pre-visit completed and red dot placed by patient's name on their procedure day (on provider's schedule).    Visit in person  Pt states weight is 198 lb   Pt given  both [facility] main # and MD on call # prior to instructions.  Informed pt to come in at the time discussed and is shown on PV instructions.  Pt instructed to use Single[employer].com or GoodRx for a price reduction on prep  Instructed pt where to find PV instructions in My Ch. Copy of instructions given to pt Instructed pt on all aspects of written instructions including med holds clothing to wear and foods to eat and not eat as well as after procedure legal restrictions and to call MD on call if needed.. Pt states understanding. Instructed pt to review instructions again prior to procedure and call main # given if has any questions or any issues. Pt states they will.

## 2024-08-03 ENCOUNTER — Encounter: Payer: Self-pay | Admitting: Gastroenterology

## 2024-08-04 ENCOUNTER — Encounter: Payer: Self-pay | Admitting: Gastroenterology

## 2024-08-04 ENCOUNTER — Ambulatory Visit (AMBULATORY_SURGERY_CENTER): Payer: PRIVATE HEALTH INSURANCE | Admitting: Gastroenterology

## 2024-08-04 VITALS — BP 126/75 | HR 63 | Temp 98.4°F | Resp 16 | Ht 66.0 in | Wt 198.0 lb

## 2024-08-04 DIAGNOSIS — D120 Benign neoplasm of cecum: Secondary | ICD-10-CM

## 2024-08-04 DIAGNOSIS — K5730 Diverticulosis of large intestine without perforation or abscess without bleeding: Secondary | ICD-10-CM | POA: Diagnosis not present

## 2024-08-04 DIAGNOSIS — D125 Benign neoplasm of sigmoid colon: Secondary | ICD-10-CM

## 2024-08-04 DIAGNOSIS — D124 Benign neoplasm of descending colon: Secondary | ICD-10-CM

## 2024-08-04 DIAGNOSIS — Z1211 Encounter for screening for malignant neoplasm of colon: Secondary | ICD-10-CM | POA: Diagnosis present

## 2024-08-04 MED ORDER — SODIUM CHLORIDE 0.9 % IV SOLN: 500.0000 mL | Freq: Once | INTRAVENOUS | Status: DC

## 2024-08-04 NOTE — Op Note (Signed)
 [facility] Patient Name: Brian Harris Procedure Date: 08/04/2024 10:03 AM MRN: [id number] Endoscopist: Glendia E. Stacia , MD, [id number] Age: 43 Referring MD:  Date of Birth: [birth year] Gender: Male Account #: [id number] Procedure:                Colonoscopy Indications:              Screening for colorectal malignant neoplasm, This                            is the patient's first colonoscopy Medicines:                Monitored Anesthesia Care Procedure:                Pre-Anesthesia Assessment:                           - Prior to the procedure, a History and Physical                            was performed, and patient medications and                            allergies were reviewed. The patient's tolerance of                            previous anesthesia was also reviewed. The risks                            and benefits of the procedure and the sedation                            options and risks were discussed with the patient.                            All questions were answered, and informed consent                            was obtained. Prior Anticoagulants: The patient has                            taken no anticoagulant or antiplatelet agents. ASA                            Grade Assessment: II - A patient with mild systemic                            disease. After reviewing the risks and benefits,                            the patient was deemed in satisfactory condition to                            undergo the procedure.  After obtaining informed consent, the colonoscope                            was passed under direct vision. Throughout the                            procedure, the patient's blood pressure, pulse, and                            oxygen saturations were monitored continuously. The                            Olympus Scope SN: [id number] was introduced through                            the anus and  advanced to the the terminal ileum,                            with identification of the appendiceal orifice and                            IC valve. The colonoscopy was performed without                            difficulty. The patient tolerated the procedure                            well. The quality of the bowel preparation was                            good. The terminal ileum, ileocecal valve,                            appendiceal orifice, and rectum were photographed. Scope In: 10:14:11 AM Scope Out: 10:36:02 AM Scope Withdrawal Time: 0 hours 16 minutes 18 seconds  Total Procedure Duration: 0 hours 21 minutes 51 seconds  Findings:                 The perianal and digital rectal examinations were                            normal. Pertinent negatives include normal                            sphincter tone and no palpable rectal lesions.                           A 7 mm polyp was found in the descending colon. The                            polyp was pedunculated. The polyp was removed with                            a cold snare.  Resection and retrieval were                            complete. Estimated blood loss was minimal.                           A 4 mm polyp was found in the cecum, just distal to                            the ICV. The polyp was flat. The polyp was removed                            with a cold snare. Resection and retrieval were                            complete. Estimated blood loss was minimal.                           An 8 mm polyp was found in the sigmoid colon. The                            polyp was sessile. The polyp was removed with a                            cold snare. Resection and retrieval were complete.                            Estimated blood loss was minimal.                           A single medium-mouthed diverticulum was found in                            the proximal transverse colon.                           The exam  was otherwise normal throughout the                            examined colon.                           The terminal ileum appeared normal.                           The retroflexed view of the distal rectum and anal                            verge was normal and showed no anal or rectal                            abnormalities. Complications:            No immediate complications. Estimated Blood Loss:     Estimated blood  loss was minimal. Impression:               - One 7 mm polyp in the descending colon, removed                            with a cold snare. Resected and retrieved.                           - One 4 mm polyp in the cecum, removed with a cold                            snare. Resected and retrieved.                           - One 8 mm polyp in the sigmoid colon, removed with                            a cold snare. Resected and retrieved.                           - Diverticulosis in the proximal transverse colon.                           - The examined portion of the ileum was normal.                           - The distal rectum and anal verge are normal on                            retroflexion view. Recommendation:           - Patient has a contact number available for                            emergencies. The signs and symptoms of potential                            delayed complications were discussed with the                            patient. Return to normal activities tomorrow.                            Written discharge instructions were provided to the                            patient.                           - Resume previous diet.                           - Continue present medications.                           -  Await pathology results.                           - Repeat colonoscopy (date not yet determined) for                            surveillance based on pathology results. Theodoros Stjames E. Stacia, MD 08/04/2024 10:44:09 AM This report  has been signed electronically.

## 2024-08-04 NOTE — Patient Instructions (Signed)
 Resume previous diet Continue present medications Await pathology results See handouts for polyps and diverticulosis YOU HAD AN ENDOSCOPIC PROCEDURE TODAY AT THE [facility]:   Refer to the procedure report that was given to you for any specific questions about what was found during the examination.  If the procedure report does not answer your questions, please call your gastroenterologist to clarify.  If you requested that your care partner not be given the details of your procedure findings, then the procedure report has been included in a sealed envelope for you to review at your convenience later.  YOU SHOULD EXPECT: Some feelings of bloating in the abdomen. Passage of more gas than usual.  Walking can help get rid of the air that was put into your GI tract during the procedure and reduce the bloating. If you had a lower endoscopy (such as a colonoscopy or flexible sigmoidoscopy) you may notice spotting of blood in your stool or on the toilet paper. If you underwent a bowel prep for your procedure, you may not have a normal bowel movement for a few days.  Please Note:  You might notice some irritation and congestion in your nose or some drainage.  This is from the oxygen used during your procedure.  There is no need for concern and it should clear up in a day or so.  SYMPTOMS TO REPORT IMMEDIATELY:  Following lower endoscopy (colonoscopy or flexible sigmoidoscopy):  Excessive amounts of blood in the stool  Significant tenderness or worsening of abdominal pains  Swelling of the abdomen that is new, acute  Fever of 100F or higher  For urgent or emergent issues, a gastroenterologist can be reached at any hour by calling [phone]. Do not use MyChart messaging for urgent concerns.   DIET:  We do recommend a small meal at first, but then you may proceed to your regular diet.  Drink plenty of fluids but you should avoid alcoholic beverages for 24 hours.  ACTIVITY:  You should  plan to take it easy for the rest of today and you should NOT DRIVE or use heavy machinery until tomorrow (because of the sedation medicines used during the test).    FOLLOW UP: Our staff will call the number listed on your records the next business day following your procedure.  We will call around 7:15- 8:00 am to check on you and address any questions or concerns that you may have regarding the information given to you following your procedure. If we do not reach you, we will leave a message.     If any biopsies were taken you will be contacted by phone or by letter within the next 1-3 weeks.  Please call us  at [phone] if you have not heard about the biopsies in 3 weeks.   SIGNATURES/CONFIDENTIALITY: You and/or your care partner have signed paperwork which will be entered into your electronic medical record.  These signatures attest to the fact that that the information above on your After Visit Summary has been reviewed and is understood.  Full responsibility of the confidentiality of this discharge information lies with you and/or your care-partner.

## 2024-08-04 NOTE — Progress Notes (Unsigned)
 [facility] History and Physical   Primary Care Physician:  Nichole Senior, MD   Reason for Procedure:   Colon cancer screening  Plan:    Screening colonoscopy  HPI: Brian Harris is a 43 y.o. male undergoing initial average risk screening colonoscopy.  He has no family history of colon cancer and no chronic GI symptoms.   The patient was provided an opportunity to ask questions and all were answered. The patient agreed with the plan    Past Medical History:  Diagnosis Date   GERD (gastroesophageal reflux disease)    Sleep apnea     Past Surgical History:  Procedure Laterality Date   VASECTOMY      Prior to Admission medications   Medication Sig Start Date End Date Taking? Authorizing Provider  levocetirizine (XYZAL) 5 MG tablet Take 5 mg by mouth every evening. Patient not taking: Reported on 07/20/2024    [provider]    Current Outpatient Medications  Medication Sig Dispense Refill   levocetirizine (XYZAL) 5 MG tablet Take 5 mg by mouth every evening. (Patient not taking: Reported on 07/20/2024)     Current Facility-Administered Medications  Medication Dose Route Frequency Provider Last Rate Last Admin   0.9 %  sodium chloride infusion  500 mL Intravenous Once Stacia Glendia BRAVO, MD        Allergies as of 08/04/2024   (No Known Allergies)    Family History  Problem Relation Age of Onset   Breast cancer Mother    Colon cancer Neg Hx    Colon polyps Neg Hx    Esophageal cancer Neg Hx    Rectal cancer Neg Hx    Stomach cancer Neg Hx     Social History   Socioeconomic History   Marital status: Single    Spouse name: Not on file   Number of children: Not on file   Years of education: Not on file   Highest education level: Not on file  Occupational History   Not on file  Tobacco Use   Smoking status: Never   Smokeless tobacco: Never  Vaping Use   Vaping status: Never Used  Substance and Sexual Activity   Alcohol use: Yes     Comment: 3 per week   Drug use: Never   Sexual activity: Not on file  Other Topics Concern   Not on file  Social History Narrative   Caffiene 2 cups daily   Married, Wife, 2 kids   Bachelors degree,    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  Physical Exam: Vital signs BP 125/80   Pulse 67   Temp 98.4 F (36.9 C) (Temporal)   Ht 5' 6 (1.676 m)   Wt 198 lb (89.8 kg)   SpO2 100%   BMI 31.96 kg/m   General:   Alert,  Well-developed, well-nourished, pleasant and cooperative in NAD Airway:  Mallampati 2 Lungs:  Clear throughout to auscultation.   Heart:  Regular rate and rhythm; no murmurs, clicks, rubs,  or gallops. Abdomen:  Soft, nontender and nondistended. Normal bowel sounds.   Neuro/Psych:  Normal mood  and affect. A and O x 3   Valla Pacey E. Stacia, MD [facility]

## 2024-08-04 NOTE — Progress Notes (Unsigned)
 Pt A/O x 3, gd SR's, pleased with anesthesia, report to RN

## 2024-08-04 NOTE — Progress Notes (Unsigned)
 Pt's states no medical or surgical changes since previsit or office visit.

## 2024-08-05 ENCOUNTER — Telehealth: Payer: Self-pay

## 2024-08-05 NOTE — Telephone Encounter (Signed)
  Follow up Call-     08/04/2024    8:28 AM  Call back number  Post procedure Call Back phone  # [phone]  Permission to leave phone message Yes     Patient questions:  Do you have a fever, pain , or abdominal swelling? No. Pain Score  0 *  Have you tolerated food without any problems? Yes.    Have you been able to return to your normal activities? Yes.    Do you have any questions about your discharge instructions: Diet   No. Medications  No. Follow up visit  No.  Do you have questions or concerns about your Care? No.  Actions: * If pain score is 4 or above: No action needed, pain <4.

## 2024-08-07 LAB — SURGICAL PATHOLOGY

## 2024-08-15 ENCOUNTER — Ambulatory Visit: Payer: Self-pay | Admitting: Gastroenterology

## 2025-02-03 ENCOUNTER — Ambulatory Visit: Admitting: Neurology

## 2025-02-03 ENCOUNTER — Ambulatory Visit: Admitting: Family Medicine
